# Patient Record
Sex: Male | Born: 1985 | Race: Black or African American | Hispanic: No | Marital: Single | Smoking: Current some day smoker
Health system: Southern US, Community
[De-identification: ages and names within clinical notes are randomized; demographics above are authoritative.]

## PROBLEM LIST (undated history)

## (undated) DIAGNOSIS — G8929 Other chronic pain: Secondary | ICD-10-CM

## (undated) DIAGNOSIS — M542 Cervicalgia: Secondary | ICD-10-CM

## (undated) DIAGNOSIS — M47812 Spondylosis without myelopathy or radiculopathy, cervical region: Secondary | ICD-10-CM

## (undated) DIAGNOSIS — M722 Plantar fascial fibromatosis: Secondary | ICD-10-CM

## (undated) DIAGNOSIS — M545 Low back pain, unspecified: Secondary | ICD-10-CM

## (undated) DIAGNOSIS — F329 Major depressive disorder, single episode, unspecified: Secondary | ICD-10-CM

## (undated) DIAGNOSIS — F419 Anxiety disorder, unspecified: Secondary | ICD-10-CM

## (undated) DIAGNOSIS — R454 Irritability and anger: Secondary | ICD-10-CM

## (undated) DIAGNOSIS — F32A Depression, unspecified: Secondary | ICD-10-CM

## (undated) DIAGNOSIS — F4310 Post-traumatic stress disorder, unspecified: Secondary | ICD-10-CM

---

## 2013-08-02 ENCOUNTER — Encounter (HOSPITAL_COMMUNITY): Payer: Self-pay | Admitting: General Practice

## 2013-08-02 ENCOUNTER — Emergency Department (HOSPITAL_COMMUNITY): Payer: Managed Care, Other (non HMO)

## 2013-08-02 ENCOUNTER — Encounter (HOSPITAL_COMMUNITY): Payer: Self-pay | Admitting: Emergency Medicine

## 2013-08-02 ENCOUNTER — Emergency Department (HOSPITAL_COMMUNITY)
Admission: EM | Admit: 2013-08-02 | Discharge: 2013-08-02 | Disposition: A | Discharge status: Other Acute Inpatient Hospital | Payer: Managed Care, Other (non HMO) | Attending: Emergency Medicine | Admitting: Emergency Medicine

## 2013-08-02 ENCOUNTER — Inpatient Hospital Stay (HOSPITAL_COMMUNITY)
Admission: AD | Admit: 2013-08-02 | Discharge: 2013-08-06 | DRG: 882 | Disposition: A | Discharge status: Home / Self Care | Source: Intra-hospital | Attending: Psychiatry | Admitting: Psychiatry

## 2013-08-02 DIAGNOSIS — R45850 Homicidal ideations: Secondary | ICD-10-CM

## 2013-08-02 DIAGNOSIS — M545 Low back pain, unspecified: Secondary | ICD-10-CM | POA: Diagnosis present

## 2013-08-02 DIAGNOSIS — F17200 Nicotine dependence, unspecified, uncomplicated: Secondary | ICD-10-CM | POA: Insufficient documentation

## 2013-08-02 DIAGNOSIS — F1010 Alcohol abuse, uncomplicated: Secondary | ICD-10-CM | POA: Insufficient documentation

## 2013-08-02 DIAGNOSIS — F4310 Post-traumatic stress disorder, unspecified: Principal | ICD-10-CM

## 2013-08-02 DIAGNOSIS — Z79899 Other long term (current) drug therapy: Secondary | ICD-10-CM

## 2013-08-02 DIAGNOSIS — F411 Generalized anxiety disorder: Secondary | ICD-10-CM

## 2013-08-02 DIAGNOSIS — R443 Hallucinations, unspecified: Secondary | ICD-10-CM | POA: Insufficient documentation

## 2013-08-02 DIAGNOSIS — M542 Cervicalgia: Secondary | ICD-10-CM | POA: Diagnosis present

## 2013-08-02 DIAGNOSIS — G8929 Other chronic pain: Secondary | ICD-10-CM | POA: Diagnosis present

## 2013-08-02 DIAGNOSIS — R4182 Altered mental status, unspecified: Secondary | ICD-10-CM | POA: Insufficient documentation

## 2013-08-02 HISTORY — DX: Depression, unspecified: F32.A

## 2013-08-02 HISTORY — DX: Anxiety disorder, unspecified: F41.9

## 2013-08-02 HISTORY — DX: Spondylosis without myelopathy or radiculopathy, cervical region: M47.812

## 2013-08-02 HISTORY — DX: Major depressive disorder, single episode, unspecified: F32.9

## 2013-08-02 HISTORY — DX: Other chronic pain: G89.29

## 2013-08-02 HISTORY — DX: Irritability and anger: R45.4

## 2013-08-02 HISTORY — DX: Post-traumatic stress disorder, unspecified: F43.10

## 2013-08-02 HISTORY — DX: Cervicalgia: M54.2

## 2013-08-02 HISTORY — DX: Low back pain: M54.5

## 2013-08-02 HISTORY — DX: Plantar fascial fibromatosis: M72.2

## 2013-08-02 HISTORY — DX: Low back pain, unspecified: M54.50

## 2013-08-02 LAB — URINALYSIS, ROUTINE W REFLEX MICROSCOPIC
Bilirubin Urine: NEGATIVE
Glucose, UA: NEGATIVE mg/dL
Hgb urine dipstick: NEGATIVE
Ketones, ur: NEGATIVE mg/dL
LEUKOCYTES UA: NEGATIVE
NITRITE: NEGATIVE
Protein, ur: NEGATIVE mg/dL
SPECIFIC GRAVITY, URINE: 1.017 (ref 1.005–1.030)
Urobilinogen, UA: 0.2 mg/dL (ref 0.0–1.0)
pH: 6 (ref 5.0–8.0)

## 2013-08-02 LAB — CBC WITH DIFFERENTIAL/PLATELET
BASOS PCT: 0 % (ref 0–1)
Basophils Absolute: 0 10*3/uL (ref 0.0–0.1)
EOS ABS: 0 10*3/uL (ref 0.0–0.7)
Eosinophils Relative: 0 % (ref 0–5)
HEMATOCRIT: 37.8 % — AB (ref 39.0–52.0)
HEMOGLOBIN: 13.4 g/dL (ref 13.0–17.0)
Lymphocytes Relative: 33 % (ref 12–46)
Lymphs Abs: 1.5 10*3/uL (ref 0.7–4.0)
MCH: 31 pg (ref 26.0–34.0)
MCHC: 35.4 g/dL (ref 30.0–36.0)
MCV: 87.5 fL (ref 78.0–100.0)
MONO ABS: 0.4 10*3/uL (ref 0.1–1.0)
MONOS PCT: 9 % (ref 3–12)
Neutro Abs: 2.6 10*3/uL (ref 1.7–7.7)
Neutrophils Relative %: 58 % (ref 43–77)
Platelets: 241 10*3/uL (ref 150–400)
RBC: 4.32 MIL/uL (ref 4.22–5.81)
RDW: 12.1 % (ref 11.5–15.5)
WBC: 4.5 10*3/uL (ref 4.0–10.5)

## 2013-08-02 LAB — RAPID URINE DRUG SCREEN, HOSP PERFORMED
Amphetamines: NOT DETECTED
BARBITURATES: NOT DETECTED
Benzodiazepines: NOT DETECTED
Cocaine: NOT DETECTED
Opiates: NOT DETECTED
Tetrahydrocannabinol: NOT DETECTED

## 2013-08-02 LAB — COMPREHENSIVE METABOLIC PANEL
ALBUMIN: 3.8 g/dL (ref 3.5–5.2)
ALT: 38 U/L (ref 0–53)
AST: 59 U/L — AB (ref 0–37)
Alkaline Phosphatase: 51 U/L (ref 39–117)
BUN: 8 mg/dL (ref 6–23)
CALCIUM: 8.3 mg/dL — AB (ref 8.4–10.5)
CO2: 22 meq/L (ref 19–32)
Chloride: 104 mEq/L (ref 96–112)
Creatinine, Ser: 0.88 mg/dL (ref 0.50–1.35)
GFR calc Af Amer: >90 mL/min (ref >90)
Glucose, Bld: 83 mg/dL (ref 70–99)
Potassium: 5.1 mEq/L (ref 3.7–5.3)
Sodium: 139 mEq/L (ref 137–147)
Total Bilirubin: 0.4 mg/dL (ref 0.3–1.2)
Total Protein: 7.5 g/dL (ref 6.0–8.3)

## 2013-08-02 LAB — POCT I-STAT TROPONIN I: Troponin i, poc: 0 ng/mL (ref 0.00–0.08)

## 2013-08-02 LAB — ETHANOL: Alcohol, Ethyl (B): 175 mg/dL — ABNORMAL HIGH (ref 0–11)

## 2013-08-02 MED ORDER — ALUM & MAG HYDROXIDE-SIMETH 200-200-20 MG/5ML PO SUSP
30.0000 mL | ORAL | Status: DC | PRN
  Administered 2013-08-05: 30 mL via ORAL

## 2013-08-02 MED ORDER — MAGNESIUM HYDROXIDE 400 MG/5ML PO SUSP: 30.0000 mL | Freq: Every day | ORAL | Status: DC | PRN

## 2013-08-02 MED ORDER — HYDROCHLOROTHIAZIDE 12.5 MG PO CAPS
12.5000 mg | ORAL_CAPSULE | Freq: Every day | ORAL | Status: DC
  Filled 2013-08-02: qty 1

## 2013-08-02 MED ORDER — IBUPROFEN 400 MG PO TABS: 600.0000 mg | ORAL_TABLET | Freq: Three times a day (TID) | ORAL | Status: DC | PRN

## 2013-08-02 MED ORDER — ACETAMINOPHEN 325 MG PO TABS: 650.0000 mg | ORAL_TABLET | ORAL | Status: DC | PRN

## 2013-08-02 MED ORDER — SODIUM CHLORIDE 0.9 % IV BOLUS (SEPSIS)
1000.0000 mL | Freq: Once | INTRAVENOUS | Status: AC
  Administered 2013-08-02: 1000 mL via INTRAVENOUS

## 2013-08-02 MED ORDER — HYDROCHLOROTHIAZIDE 12.5 MG PO CAPS
12.5000 mg | ORAL_CAPSULE | Freq: Every day | ORAL | Status: DC
  Administered 2013-08-02 – 2013-08-06 (×5): 12.5 mg via ORAL
  Filled 2013-08-02 (×7): qty 1

## 2013-08-02 MED ORDER — ACETAMINOPHEN 325 MG PO TABS: 650.0000 mg | ORAL_TABLET | Freq: Four times a day (QID) | ORAL | Status: DC | PRN

## 2013-08-02 MED ORDER — TRAZODONE HCL 50 MG PO TABS
50.0000 mg | ORAL_TABLET | Freq: Every evening | ORAL | Status: DC | PRN
  Administered 2013-08-02: 50 mg via ORAL
  Filled 2013-08-02: qty 1

## 2013-08-02 MED ORDER — NALOXONE HCL 0.4 MG/ML IJ SOLN
0.4000 mg | Freq: Once | INTRAMUSCULAR | Status: AC
  Administered 2013-08-02: 0.4 mg via INTRAVENOUS

## 2013-08-02 MED ORDER — LORAZEPAM 1 MG PO TABS
1.0000 mg | ORAL_TABLET | Freq: Three times a day (TID) | ORAL | Status: DC | PRN
  Administered 2013-08-02: 1 mg via ORAL
  Filled 2013-08-02: qty 1

## 2013-08-02 NOTE — Progress Notes (Signed)
Patient ID: Spencer Blankenship, male   DOB: 16-Mar-1986, 28 y.o.   MRN: [id number]  Spencer Blankenship is a 28 year old male admitted [facility]. Patient has a diagnosis of PTSD. Patient reports that he is anxious and agitated. Patient denies SI and A/V hallucinations but does report HI from time to time for his wife. Patient states, "I'm not [homicidal] right now but I told her if she takes my house and car she better look out for me." Patient reports that he has a son that he does not want because he already has an 28 year old son. Pt stated that she could take the younger son and leave. Patient has flat affect but cooperative during admission. Patient denies pain at this time. Q15 minute safety checks initiated and maintained. Patient had no questions or concerns at this time. Patient had elevated BP and NP was notified. Patient reports that lately he has been drinking every day but does not drink more than 6 drinks. UDS shows alcohol level of 175 this morning. Patient reports no symptoms of withdrawal.

## 2013-08-02 NOTE — ED Notes (Addendum)
Pt brought to ED by family with symptoms of PTSD.  Pt just came home from [employer] on Dec. 23.  Pt crying.   Appears very anxious.  Pt yelling "nobody is taking my house".

## 2013-08-02 NOTE — ED Notes (Signed)
MD at bedside. 

## 2013-08-02 NOTE — Tx Team (Addendum)
Initial Interdisciplinary Treatment Plan  PATIENT STRENGTHS: (choose at least two) Average or above average intelligence General fund of knowledge  PATIENT STRESSORS: Marital or family conflict Substance abuse   PROBLEM LIST: Problem List/Patient Goals Date to be addressed Date deferred Reason deferred Estimated date of resolution  PTSD      Anger      HI      Risk for Suicide                                     DISCHARGE CRITERIA:  Ability to meet basic life and health needs Improved stabilization in mood, thinking, and/or behavior  PRELIMINARY DISCHARGE PLAN: Attend aftercare/continuing care group Participate in family therapy  PATIENT/FAMIILY INVOLVEMENT: This treatment plan has been presented to and reviewed with the patient, Spencer Blankenship.  The patient and family have been given the opportunity to ask questions and make suggestions.  Marzetta BoardDopson, Christa E 08/02/2013, 7:33 PM

## 2013-08-02 NOTE — Progress Notes (Signed)
Problem list added per Christa

## 2013-08-02 NOTE — ED Notes (Addendum)
Pt. States that he was just released from [employer] on Dec. 23, 2014. 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. He has been having problems relaxing and fitting in since his release. He states he is feeling a lot of pressure due to a new baby with his wife that he said he did not want. 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 He was put on medications while in [employer] but he doesn't like the way they make him feel so he stop taking them. Since he stopped taking them he has noticed that it has been harder to control his anger impulses and the dreaming has become more frequent. There have been some outburst at home. He states that he was in both [place]. He was sent to a Psychologist just pryor to release for his PTSD. He tried to receive better treatment but he felt that because he was being released he was pushed out on through and not helped adequately. I asked him has he been [place] [facility] since [place] has been [place] and he said that he had a person who was responsible for his transition to the outside that he had missed phone calls with recently. The incident tonight stemmed around a friend having him try THC to help him relax but it had the opposite reaction. It made him paranoid. He is still stating that his house was his dream. When ask if he would hurt himself or anyone else. He answers " I would kill my wife if she tries to take my house." I spoke to him about his impulse control and Coping skills the fact that with him having PTSD they are not the same as everyone else. He agreed that he does need help in this area and would like to talk to someone.
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
## 2013-08-02 NOTE — ED Notes (Signed)
Called pt's mother per pt's request and advised pt is in ED and waiting placement. Advised her he will have to call to advise her of disposition. Voiced understanding. Requested for pt to call her.

## 2013-08-02 NOTE — ED Notes (Signed)
[facility] here to take pt. Belongings and paperwork given to [facility].

## 2013-08-02 NOTE — Progress Notes (Signed)
Adult Psychoeducational Group Note  Date:  08/02/2013 Time:  8:00 pm  Group Topic/Focus:  Wrap-Up Group:   The focus of this group is to help patients review their daily goal of treatment and discuss progress on daily workbooks.  Participation Level:  Minimal  Participation Quality:  Appropriate and Sharing  Affect:  Appropriate  Cognitive:  Appropriate  Insight: Appropriate  Engagement in Group:  Engaged  Modes of Intervention:  Discussion, Education, Socialization and Support  Additional Comments:  Pt stated the he does not feel comfortable speaking about personal issues in a group setting.   Nidya Bouyer 08/02/2013, 10:39 PM

## 2013-08-02 NOTE — ED Provider Notes (Signed)
CSN: [id number]     Arrival date & time 08/02/13  0056 History   First MD Initiated Contact with Patient 08/02/13 0127     Chief Complaint  Patient presents with  . Panic Attack   (Consider location/radiation/quality/duration/timing/severity/associated sxs/prior Treatment) The history is provided by the patient. The history is limited by the condition of the patient.  Spencer Blankenship is a 28 y.o. male hx of PTSD recently started on medicine here with panic attack and hallucinations. He was recently discharged from the [employer] about a week ago and started on medicines for PTSD. He was told not to drink any alcohol and today she was at a party and drank some alcohol. Subsequently he began having some panic attacks and hallucinations. In triage he told the nurse that he was anxious that people don't take her his house away. However in the exam room he is very lethargic and only responds to sternal rub. He was unable to give much history. Denies any other overdose.   Level V caveat- AMS   History reviewed. No pertinent past medical history. History reviewed. No pertinent past surgical history. No family history on file. History  Substance Use Topics  . Smoking status: Current Some Day Smoker  . Smokeless tobacco: Not on file  . Alcohol Use: Yes    Review of Systems  Unable to perform ROS: Acuity of condition    Allergies  Review of patient's allergies indicates not on file.  Home Medications  No current outpatient prescriptions on file. BP 128/78  Pulse 76  Temp(Src) 97.9 F (36.6 C) (Oral)  Resp 13  SpO2 97% Physical Exam  Nursing note and vitals reviewed. Constitutional:  Lethargic, arousable to sternal rub. Even with ammonia, barely arousable.   HENT:  Head: Normocephalic.  Mouth/Throat: Oropharynx is clear and moist.  Eyes: Conjunctivae and EOM are normal. Pupils are equal, round, and reactive to light.  Pupils equal and reactive   Neck: Normal range of motion. Neck supple.   Cardiovascular: Normal rate, regular rhythm and normal heart sounds.   Pulmonary/Chest: Effort normal and breath sounds normal. No respiratory distress. He has no wheezes. He has no rales.  Abdominal: Soft. Bowel sounds are normal. He exhibits no distension. There is no tenderness. There is no rebound.  Musculoskeletal: Normal range of motion.  Neurological:  Lethargic. Able to move all extremities   Skin: Skin is warm and dry.  Psychiatric:  Unable due to mental status.     ED Course  Procedures (including critical care time) Labs Review Labs Reviewed  CBC WITH DIFFERENTIAL - Abnormal; Notable for the following:    HCT 37.8 (*)    All other components within normal limits  ETHANOL - Abnormal; Notable for the following:    Alcohol, Ethyl (B) 175 (*)    All other components within normal limits  COMPREHENSIVE METABOLIC PANEL - Abnormal; Notable for the following:    Calcium 8.3 (*)    AST 59 (*)    All other components within normal limits  URINALYSIS, ROUTINE W REFLEX MICROSCOPIC  URINE RAPID DRUG SCREEN (HOSP PERFORMED)  POCT I-STAT TROPONIN I   Imaging Review Ct Head Wo Contrast  08/02/2013   CLINICAL DATA:  Altered mental status  EXAM: CT HEAD WITHOUT CONTRAST  TECHNIQUE: Contiguous axial images were obtained from the base of the skull through the vertex without intravenous contrast.  COMPARISON:  None available  FINDINGS: There is no acute intracranial hemorrhage or infarct. No mass lesion or midline shift.  Gray-white matter differentiation is well maintained. Ventricles are normal in size without evidence of hydrocephalus. CSF containing spaces are within normal limits. No extra-axial fluid collection.  The calvarium is intact.  Orbital soft tissues are within normal limits.  The paranasal sinuses and mastoid air cells are well pneumatized and free of fluid.  Scalp soft tissues are unremarkable.  IMPRESSION: No acute intracranial process.   Electronically Signed   By: Rise Mu M.D.   On: 08/02/2013 05:37   Dg Chest Portable 1 View  08/02/2013   CLINICAL DATA:  Altered mental status  EXAM: PORTABLE CHEST - 1 VIEW  COMPARISON:  None available  FINDINGS: The cardiac silhouette is prominent, likely related to shallow lung inflation and AP projection.  The lungs are mildly hypoinflated. . No airspace consolidation, pleural effusion, or pulmonary edema is identified. There is no pneumothorax.  No acute osseous abnormality identified.  IMPRESSION: No acute cardiopulmonary process.   Electronically Signed   By: Rise Mu M.D.   On: 08/02/2013 02:22    EKG Interpretation   None       MDM  No diagnosis found. Spencer Blankenship is a 28 y.o. male here with anxiety, hallucinations. He is quite lethargic here. Will consider overdose vs metabolic reasons vs psych. Will check drug tox, urine tox, labs. Will attempt to give narcan and reassess. Will need psych eval when more awake.   6:23 AM Alert and awake now. Labs unremarkable. CT head done since he was altered and was normal. ETOH 175. Will call TTS. Medically cleared now.    Richardean Canal, MD 08/02/13 [phone]

## 2013-08-03 DIAGNOSIS — F4310 Post-traumatic stress disorder, unspecified: Principal | ICD-10-CM

## 2013-08-03 DIAGNOSIS — F411 Generalized anxiety disorder: Secondary | ICD-10-CM

## 2013-08-03 DIAGNOSIS — F1010 Alcohol abuse, uncomplicated: Secondary | ICD-10-CM | POA: Diagnosis present

## 2013-08-03 DIAGNOSIS — R45850 Homicidal ideations: Secondary | ICD-10-CM

## 2013-08-03 MED ORDER — PRAZOSIN HCL 2 MG PO CAPS
2.0000 mg | ORAL_CAPSULE | Freq: Every day | ORAL | Status: DC
  Administered 2013-08-03: 2 mg via ORAL
  Filled 2013-08-03 (×2): qty 1
  Filled 2013-08-03: qty 2

## 2013-08-03 MED ORDER — CITALOPRAM HYDROBROMIDE 10 MG PO TABS
10.0000 mg | ORAL_TABLET | Freq: Every day | ORAL | Status: DC
  Administered 2013-08-03 – 2013-08-05 (×3): 10 mg via ORAL
  Filled 2013-08-03 (×4): qty 1

## 2013-08-03 MED ORDER — FLUOXETINE HCL 20 MG PO CAPS: 20.0000 mg | ORAL_CAPSULE | Freq: Every day | ORAL | Status: DC

## 2013-08-03 NOTE — Progress Notes (Signed)
Patient ID: Spencer Blankenship, male   DOB: 05-07-1986, 28 y.o.   MRN: [id number]  D: Pt informed the writer that while in the [employer] he had a therapist. Stated that after discharge he was referred to a therapist in [place]. Stated he came to [place] along with his wife to visit his cousin. Stated that he was already feeling stressed and "blew up" after his wife began "going off on his cousin" because his cousin wanted him to smoke a joint.  Pt stated he began telling his wife that he didn't want to be married or have any more children. Stated she began arguing and his cousin removed him from the home and went to find some thc. Pt stated, "I really don't think the marijuana helped".  Pt stated that after several minutes of riding around his cousin took him to the hosp. Stated, "that's how I ended up over here". Pt plans to return home after discharge.  A:  Support and encouragement was offered. 15 min checks continued for safety.  R: Pt remains safe.

## 2013-08-03 NOTE — Progress Notes (Signed)
Adult Psychoeducational Group Note  Date:  08/03/2013 Time:  10:00 am  Group Topic/Focus:  Wellness Toolbox:   The focus of this group is to discuss various aspects of wellness, balancing those aspects and exploring ways to increase the ability to experience wellness.  Patients will create a wellness toolbox for use upon discharge.  Participation Level:  Did Not Attend  Cranford MonBeaudry, Christalynn Boise Evans 08/03/2013, 2:45 PM
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
## 2013-08-03 NOTE — [facility] Suicide Risk Assessment (Signed)
Suicide Risk Assessment  Admission Assessment     Nursing information obtained from:  Patient Demographic factors:  Male Current Mental Status:  Thoughts of violence towards others Loss Factors:  Loss of significant relationship Historical Factors:  NA Risk Reduction Factors:  Responsible for children under 28 years of age;Living with another person, especially a relative  CLINICAL FACTORS:   Alcohol/Substance Abuse/Dependencies  COGNITIVE FEATURES THAT CONTRIBUTE TO RISK:  Polarized thinking    SUICIDE RISK:   Minimal: No identifiable suicidal ideation.  Patients presenting with no risk factors but with morbid ruminations; may be classified as minimal risk based on the severity of the depressive symptoms  PLAN OF CARE:1. Admit for crisis management and stabilization. 2. Medication management to reduce current symptoms to base line and improve the   patient's overall level of functioning 3. Treat health problems as indicated. 4. Develop treatment plan to decrease risk of relapse upon discharge and the need for  readmission. 5. Psycho-social education regarding relapse prevention and self care. 6. Health care follow up as needed for medical problems. 7. Restart home medications where appropriate.   I certify that inpatient services furnished can reasonably be expected to improve the patient's condition.  Thedore MinsAkintayo, Nyssa Sayegh, MD 08/03/2013, 10:10 AM
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
## 2013-08-03 NOTE — [facility] Group Notes (Signed)
[facility] LCSW Group Therapy  08/03/2013 1:15 pm  Type of Therapy: Process Group Therapy  Participation Level:  Did not attend    Summary of Progress/Problems: Today's group addressed the issue of overcoming obstacles.  Patients were asked to identify their biggest obstacle post d/c that stands in the way of their on-going success, and then problem solve as to how to manage this.  Spencer Blankenship, Brach Birdsall B 08/03/2013   4:17 PM

## 2013-08-03 NOTE — Tx Team (Signed)
  Interdisciplinary Treatment Plan Update   Date Reviewed:  08/03/2013  Time Reviewed:  8:29 AM  Progress in Treatment:   Attending groups: In consistently Participating in groups: Minimally Taking medication as prescribed: Yes  Tolerating medication: Yes Family/Significant other contact made: No  Patient understands diagnosis: Yes AEB asking for help with his PTSD symptoms, and psychosis Discussing patient identified problems/goals with staff: Yes  See initial care plan Medical problems stabilized or resolved: Yes Denies suicidal/homicidal ideation: Yes  In tx team Patient has not harmed self or others: Yes  For review of initial/current patient goals, please see plan of care.  Estimated Length of Stay:  4-5 days  Reason for Continuation of Hospitalization: Anxiety Depression Hallucinations Medication stabilization  New Problems/Goals identified:  N/A  Discharge Plan or Barriers:   return home, follow up outpt  Additional Comments: Spencer Blankenship is an 28 y.o. male That [facility] he was just released from [employer] on Dec. 23, 2014. 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. He has been having problems relaxing and fitting in since his release by report. He states he is feeling a lot of pressure due to a new baby with his wife that he said he did not want. 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 Pt stated he was put on medications while in [employer] but he doesn't like the way they make him feel so he stopped taking them. Since he stopped taking them he has noticed that it has been harder to control his anger impulses and dreaming has become more frequent and vivid. There have been some anger outbursts at home per his report. He states that he was in both [place] and [place]. He [facility]  diagnosis of PTSD.    Attendees:  Signature: Thedore MinsMojeed Akintayo, MD 08/03/2013 8:29 AM   Signature: Richelle Itood Isaic Syler, LCSW 08/03/2013 8:29 AM  Signature: Fransisca KaufmannLaura Davis, NP 08/03/2013 8:29 AM  Signature: Joslyn Devonaroline Beaudry, RN 08/03/2013 8:29 AM  Signature: Liborio NixonPatrice White, RN 08/03/2013 8:29 AM  Signature:  08/03/2013 8:29 AM  Signature:   08/03/2013 8:29 AM  Signature:    Signature:    Signature:    Signature:    Signature:    Signature:      Scribe for Treatment Team:   Richelle Itood Katianna Mcclenney, LCSW  08/03/2013 8:29 AM

## 2013-08-03 NOTE — Progress Notes (Signed)
  D: Pt observed sleeping in bed with eyes closed. RR even and unlabored. No distress noted  .  A: Q 15 minute checks were done for safety.  R: safety maintained on unit.  

## 2013-08-03 NOTE — [facility] Group Notes (Signed)
[facility]   08/03/2013 8:30 AM  Participation Quality:  Engaged  Mood/Affect:  Depressed  Depression Rating:  8  Anxiety Rating:  6  Thoughts of Suicide:  No Will you contract for safety?   Yes  Current AVH:  No  Plan for Discharge/Comments:  Kendell Baneroy states he came to [place] with wife to visit relatives and friends, and began to experience PTSD symptoms, along with depression.  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  [facility]  Transportation Means: unk  Supports: family  Kiribatiorth, WhartonRodney B

## 2013-08-03 NOTE — Progress Notes (Signed)
Adult Psychoeducational Group Note  Date:  08/03/2013 Time:  10:00 PM  Group Topic/Focus:  Wrap-Up Group:   The focus of this group is to help patients review their daily goal of treatment and discuss progress on daily workbooks.  Participation Level:  Active  Participation Quality:  Appropriate  Affect:  Appropriate  Cognitive:  Appropriate  Insight: Appropriate  Engagement in Group:  Engaged  Modes of Intervention:  Support  Additional Comments:  Patient attended and participated in group tonight. He reports that today he caught up on some sleep. Went to dinner and watched a movie. For his wellness he plans to continue taking his medication and learning ways to deal with triggers.  Spencer Blankenship, Spencer Blankenship [facility] 08/03/2013, 10:00 PM

## 2013-08-04 MED ORDER — QUETIAPINE FUMARATE 50 MG PO TABS
50.0000 mg | ORAL_TABLET | Freq: Every day | ORAL | Status: DC
  Administered 2013-08-04 – 2013-08-05 (×2): 50 mg via ORAL
  Filled 2013-08-04 (×3): qty 1

## 2013-08-04 MED ORDER — PRAZOSIN HCL 1 MG PO CAPS
5.0000 mg | ORAL_CAPSULE | Freq: Every day | ORAL | Status: DC
  Administered 2013-08-04 – 2013-08-05 (×2): 5 mg via ORAL
  Filled 2013-08-04 (×3): qty 5

## 2013-08-04 MED ORDER — QUETIAPINE FUMARATE 25 MG PO TABS
25.0000 mg | ORAL_TABLET | Freq: Every day | ORAL | Status: DC
  Administered 2013-08-04: 25 mg via ORAL
  Filled 2013-08-04 (×4): qty 1

## 2013-08-04 NOTE — [facility] Counselor (Signed)
Adult Comprehensive Assessment  Patient ID: Spencer Blankenship, male   DOB: September 21, 1985, 28 y.o.   MRN: [id number]  Information Source: Information source: Patient  Current Stressors:  Educational / Learning stressors: N/A Employment / Job issues: Yes  Currently unemployed Family Relationships: Yes  Stressed with the birth of a new son.  Feels like his wife needs too much from him right now. Financial / Lack of resources (include bankruptcy): Yes No income Housing / Lack of housing: N/A Physical health (include injuries & life threatening diseases): Yes  Lots of pain that does not allow him to be as active as he would like Social relationships: N/A Substance abuse: Yes  Admits to increased drinking over the last month since birth of son Bereavement / Loss: N/A  Living/Environment/Situation:  Living conditions (as described by patient or guardian): [place] How long has patient lived in current situation?: 4 years,  [place] to [place] What is atmosphere in current home: Supportive;Comfortable;Chaotic  Family History:  Marital status: Married Number of Years Married: 4 What types of issues is patient dealing with in the relationship?: "Its hectic with a newborn" Does patient have children?: Yes How many children?: 2 How is patient's relationship with their children?: good  Admits that he feels overwhelmed as father "I don't think I am a good role model, and I don't have patience."  Childhood History:  By whom was/is the patient raised?: Mother/father and step-parent (mother and grandmother) Additional childhood history information: father was not in picture Description of patient's relationship with caregiver when they were a child: good Patient's description of current relationship with people who raised him/her: awkward with dad, good with mom and grandmother Does patient have siblings?: Yes Number of Siblings:  4 Description of patient's current relationship with siblings: All step siblings-no communication Did patient suffer any verbal/emotional/physical/sexual abuse as a child?: No Did patient suffer from severe childhood neglect?: No Has patient ever been sexually abused/assaulted/raped as an adolescent or adult?: No Was the patient ever a victim of a crime or a disaster?: No Witnessed domestic violence?: No Has patient been effected by domestic violence as an adult?: No  Education:  Highest grade of school patient has completed: Diploma through [employer] Currently a Consulting civil engineerstudent?: No Learning disability?: No  Employment/Work Situation:   Employment situation: Unemployed Patient's job has been impacted by current illness: Yes Describe how patient's job has been impacted: PTSD is getting in the way What is the longest time patient has a held a job?: 6 years 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  "I just go to the garage and drink" Alcohol/Substance Abuse Treatment Hx: Past Tx, Outpatient If yes, describe treatment: [facility] Has alcohol/substance abuse ever caused legal problems?: No  Social Support System:   Patient's Community Support System: Fair Describe Community Support System: family Type of faith/religion: [employer] How does patient's faith help to cope with current illness?: N/A  Leisure/Recreation:   Leisure  and Hobbies: nothing "I can't run like I used to cause of pain."  "It's  not good"  Strengths/Needs:   What things does the patient do well?: provider, research, planning In what areas does patient struggle / problems for patient: sanity  Discharge Plan:   Does patient have access to transportation?: Yes Will patient be returning to same living situation after discharge?: Yes Currently receiving community mental health services: Yes (From Whom) (Dr in [place])  Summary/Recommendations:   Summary and Recommendations (to be completed by the evaluator): Spencer Blankenship is a 28 YO AA male 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  He suffers from PTSD, physical issues, and is stressed about not having a job and running out of money, as well as an infant in the home.  "I feel too worn down for only being 28 Years old."  He hopes to get help for vocational training/placement through the [place], and is working with people there to increase his connection with them.  He can benefit from crises stabilization, medication managment, therapeutic milieu and referral for services.  Daryel Gerald B. 08/04/2013

## 2013-08-04 NOTE — Progress Notes (Signed)
D: Pt denies SI/HI/AVH. Pt presents with flat affect and depressed mood. Pt reports poor sleep last night and nightmares. Pt c/o of feeling drowsy this morning. Pt stated that he took Seroquel early this morning because he could not sleep d/t nightmares around 2 am . No adverse reactions to meds verbalized by pt. Pt concerned about starting new meds and then returning back home to take his home meds. Writer informed pt to talk to his psychiatrist before taking home meds. Pt compliant with taking meds and attending groups. A: Medications administered as ordered per MD. Verbal support given. Pt encouraged to attend groups. 15 minute checks performed for safety. R: Pt safety maintained.

## 2013-08-04 NOTE — Progress Notes (Signed)
[facility]  08/04/2013 2:23 PM Spencer Blankenship  MRN:  [id number] Subjective:   Patient states "I am just getting frustrated. I did not sleep well last night. My wife is trying to manage things that I normally do but I am stuck here. I can't believe my cousin brought me here. If I had not drank that alcohol then none of this would have happened."  Objective:  Patient observed resting in bed this afternoon. He has per documentation in epic attending most groups on the unit. Patient appears to be minimizing the reasons that led to his admission. He asked Clinical research associatewriter [facility] as he feels that he does not need to be in this hospital. He continues to appear depressed and anxious. Patient complains of very poor sleep last night and is excessively worrying about issues outside the hospital.   Diagnosis:   DSM5: Schizophrenia Disorders:  Obsessive-Compulsive Disorders:  Trauma-Stressor Disorders: Posttraumatic Stress Disorder (309.81)  Substance/Addictive Disorders:  Depressive Disorders:  AXIS I: Posttraumatic Stress Disorder, Anxiety state, unspecified, Alcohol abuse  AXIS II: Deferred  AXIS III:  Past Medical History   Diagnosis  Date   .  Chronic lower back pain    .  Anxiety    .  Depression    .  Anger    .  Plantar fasciitis    .  Post traumatic stress disorder (PTSD)    .  Chronic neck pain    .  Degenerative arthritis of cervical spine     AXIS IV: economic problems, occupational problems, other psychosocial or environmental problems and problems with primary support group  AXIS V: 41-50 serious symptoms   ADL's:  Intact  Sleep: Poor  Appetite:  Fair  Suicidal Ideation:  Denies  Homicidal Ideation:  Denies  AEB (as evidenced by):  Psychiatric Specialty Exam: Review of Systems  Constitutional: Negative.   HENT: Negative.   Eyes: Negative.   Respiratory: Negative.   Cardiovascular: Negative.   Gastrointestinal: Negative.   Genitourinary: Negative.    Musculoskeletal: Negative.   Skin: Negative.   Neurological: Negative.   Endo/Heme/Allergies: Negative.   Psychiatric/Behavioral: Positive for depression and substance abuse. Negative for suicidal ideas, hallucinations and memory loss. The patient is nervous/anxious and has insomnia.     Blood pressure 135/88, pulse 123, temperature 98 F (36.7 C), temperature source Oral, resp. rate 18, height 6\' 3"  (1.905 m), weight 117.028 kg (258 lb).Body mass index is 32.25 kg/(m^2).  General Appearance: Disheveled  Eye Contact::  Good  Speech:  Clear and Coherent  Volume:  Normal  Mood:  Anxious and Irritable  Affect:  Constricted  Thought Process:  Goal Directed and Intact  Orientation:  Full (Time, Place, and Person)  Thought Content:  Rumination  Suicidal Thoughts:  No  Homicidal Thoughts:  No  Memory:  Immediate;   Good Recent;   Good Remote;   Good  Judgement:  Fair  Insight:  Shallow  Psychomotor Activity:  Decreased  Concentration:  Good  Recall:  Good  Akathisia:  No  Handed:  Right  AIMS (if indicated):     Assets:  Communication Skills Desire for Improvement Housing Intimacy Leisure Time Physical Health Resilience Social Support  Sleep:  Number of Hours: 2.25   Current Medications: Current Facility-Administered Medications  Medication Dose Route Frequency Provider Last Rate Last Dose  . acetaminophen (TYLENOL) tablet 650 mg  650 mg Oral Q6H PRN Nanine MeansJamison Lord, NP      . alum &  mag hydroxide-simeth (MAALOX/MYLANTA) 200-200-20 MG/5ML suspension 30 mL  30 mL Oral Q4H PRN Nanine Means, NP      . citalopram (CELEXA) tablet 10 mg  10 mg Oral Daily Mojeed Akintayo   10 mg at 08/04/13 0755  . hydrochlorothiazide (MICROZIDE) capsule 12.5 mg  12.5 mg Oral Daily Nanine Means, NP   12.5 mg at 08/04/13 0755  . magnesium hydroxide (MILK OF MAGNESIA) suspension 30 mL  30 mL Oral Daily PRN Nanine Means, NP      . prazosin (MINIPRESS) capsule 5 mg  5 mg Oral QHS Mojeed Akintayo      .  QUEtiapine (SEROQUEL) tablet 25 mg  25 mg Oral QHS Kerry Hough, PA-C   25 mg at 08/04/13 0159    Lab Results: No results found for this or any previous visit (from the past 48 hour(s)).  Physical Findings: AIMS: Facial and Oral Movements Muscles of Facial Expression: None, normal Lips and Perioral Area: None, normal Jaw: None, normal Tongue: None, normal,Extremity Movements Upper (arms, wrists, hands, fingers): None, normal Lower (legs, knees, ankles, toes): None, normal, Trunk Movements Neck, shoulders, hips: None, normal, Overall Severity Severity of abnormal movements (highest score from questions above): None, normal Incapacitation due to abnormal movements: None, normal Patient's awareness of abnormal movements (rate only patient's report): No Awareness,    CIWA:    COWS:     Treatment Plan Summary: Daily contact with patient to assess and evaluate symptoms and progress in treatment Medication management  Plan: Continue crisis management and stabilization.  Medication management: Increase Seroquel to 50 mg at hs for insomnia/anxiety.  Encouraged patient to attend groups and participate in group counseling sessions and activities.  Discharge plan in progress.  Continue current treatment plan.   Medical Decision Making Problem Points:  Established problem, stable/improving (1) and Review of psycho-social stressors (1) Data Points:  Review of medication regiment & side effects (2)  I certify that inpatient services furnished can reasonably be expected to improve the patient's condition.   Pamala Hayman NP-C 08/04/2013, 2:23 PM

## 2013-08-04 NOTE — [facility] Group Notes (Signed)
[facility] Group Notes:  (Nursing/MHT/Case Management/Adjunct)  Date:  08/04/2013  Time:  10:36 AM  Type of Therapy:  wellness  Participation Level:  Active  Participation Quality:  Appropriate  Affect:  Appropriate  Cognitive:  Appropriate  Insight:  Appropriate  Engagement in Group:  Engaged  Modes of Intervention:  Discussion and Education  Summary of Progress/Problems: "Short-term goal is to take meds, get in touch with my old psychiatrist and find activities to take my mind of things".   Dreanna Kyllo L 08/04/2013, 10:36 AM

## 2013-08-04 NOTE — [facility] Group Notes (Signed)
Adult Psychoeducational Group Note  Date:  08/04/2013 Time:  9:59 PM  Group Topic/Focus:  Wrap-Up Group:   The focus of this group is to help patients review their daily goal of treatment and discuss progress on daily workbooks.  Participation Level:  Active  Participation Quality:  Appropriate  Affect:  Appropriate  Cognitive:  Appropriate  Insight: Appropriate  Engagement in Group:  Engaged  Modes of Intervention:  Discussion  Additional Comments:  Spencer Blankenship stated his day was all right.  He enjoyed lunch and dinner, got a nap, talked to social worker 1:1, and the groups were productive.  He stated that he is a caring and understanding person.  Spencer Blankenship, Spencer Blankenship A 08/04/2013, 9:59 PM

## 2013-08-04 NOTE — [facility] Group Notes (Signed)
[facility] LCSW Group Therapy  08/04/2013 , 4:32 PM   Type of Therapy:  Group Therapy  Participation Level:  Active  Participation Quality:  Attentive  Affect:  Appropriate  Cognitive:  Alert  Insight:  Improving  Engagement in Therapy:  Engaged  Modes of Intervention:  Discussion, Exploration and Socialization  Summary of Progress/Problems: Today's group focused on the term Diagnosis.  Participants were asked to define the term, and then pronounce whether it is a negative, positive or neutral term.  Kendell Baneroy was engaged throughout.  He gave examples of self diagnosis and diagnosis by others besides Drs.  He also talked a lot about labels Quincy Carnes["they are too confining"] and how they often lead to stereotypes.  This then led to a discussion about racism, and he gave examples of how that has affected him personally.  He was patient and tolerant of another patient who was focused on "reverse discrimination."  Daryel Geraldorth, Rucker Pridgeon B 08/04/2013 , 4:32 PM

## 2013-08-05 MED ORDER — HYDROXYZINE HCL 50 MG PO TABS
50.0000 mg | ORAL_TABLET | Freq: Four times a day (QID) | ORAL | Status: DC | PRN
  Administered 2013-08-05: 50 mg via ORAL
  Filled 2013-08-05: qty 1

## 2013-08-05 MED ORDER — CITALOPRAM HYDROBROMIDE 20 MG PO TABS
20.0000 mg | ORAL_TABLET | Freq: Every day | ORAL | Status: DC
  Administered 2013-08-06: 20 mg via ORAL
  Filled 2013-08-05 (×2): qty 1

## 2013-08-05 NOTE — Progress Notes (Signed)
Adult Psychoeducational Group Note  Date:  08/05/2013 Time:  10:01 PM  Group Topic/Focus:  Wrap-Up Group:   The focus of this group is to help patients review their daily goal of treatment and discuss progress on daily workbooks.  Participation Level:  Minimal  Participation Quality:  Sharing  Affect:  Appropriate  Cognitive:  Appropriate  Insight: Limited  Engagement in Group:  Limited  Modes of Intervention:  Support  Additional Comments:  Patient attended and participated in group tonight. He reports that he went to his meals, went to groups, wrote in his journal, took a nap, and called home today. For his personal development he plans to work on his physical and mental wellness. He plans to join a fitness center by his house, take his medication as schedule and [facility] to improve on himself  Scot DockFrancis, Jared Cahn Dacosta 08/05/2013, 10:01 PM

## 2013-08-05 NOTE — Tx Team (Signed)
  Interdisciplinary Treatment Plan Update   Date Reviewed:  08/05/2013  Time Reviewed:  1:50 PM  Progress in Treatment:   Attending groups: Yes Participating in groups: Yes Taking medication as prescribed: Yes  Tolerating medication: Yes Family/Significant other contact made: Yes  Patient understands diagnosis: Yes  Discussing patient identified problems/goals with staff: Yes Medical problems stabilized or resolved: Yes Denies suicidal/homicidal ideation: Yes Patient has not harmed self or others: Yes  For review of initial/current patient goals, please see plan of care.  Estimated Length of Stay:  Likely d/c tomorrow  Reason for Continuation of Hospitalization:   New Problems/Goals identified:  N/A  Discharge Plan or Barriers:   return home, follow up outpt  Additional Comments:  Attendees:  Signature: Thedore MinsMojeed Akintayo, MD 08/05/2013 1:50 PM   Signature: Richelle Itood Jehiel Koepp, LCSW 08/05/2013 1:50 PM  Signature: Fransisca KaufmannLaura Davis, NP 08/05/2013 1:50 PM  Signature: Joslyn Devonaroline Beaudry, RN 08/05/2013 1:50 PM  Signature: Liborio NixonPatrice White, RN 08/05/2013 1:50 PM  Signature:  08/05/2013 1:50 PM  Signature:   08/05/2013 1:50 PM  Signature:    Signature:    Signature:    Signature:    Signature:    Signature:      Scribe for Treatment Team:   Richelle Itood Janyah Singleterry, LCSW  08/05/2013 1:50 PM

## 2013-08-05 NOTE — [facility] Suicide Risk Assessment (Signed)
[facility] INPATIENT:  Family/Significant Other Suicide Prevention Education  Suicide Prevention Education:  Contact Attempts: Spencer BailiffRobin Blankenship, mother, [phone] has been identified by the patient as the family member/significant other with whom the patient will be residing, and identified as the person(s) who will aid the patient in the event of a mental health crisis.  With written consent from the patient, two attempts were made to provide suicide prevention education, prior to and/or following the patient's discharge.  We were unsuccessful in providing suicide prevention education.  A suicide education pamphlet was given to the patient to share with family/significant other.  Date and time of first attempt:08/04/2013  9:40AM  Date and time of second attempt:08/05/2013 3:05 PM   Spencer Blankenship, Spencer Blankenship 08/05/2013, 3:04 PM

## 2013-08-05 NOTE — Progress Notes (Signed)
Patient ID: Spencer Blankenship, male   DOB: 1985/08/09, 28 y.o.   MRN: [id number]  D: Pt informed the writer of chest pain. Stated that he felt it earlier and was given maalox. However, stated it hadn't completely gone away. When asked if he has a history of heart problems, pt stated "my doctor said that it's caused by stress." Pt attempted to give an analogy of the skin on the chicken and how when he gets stressed the skin tightens causing chest pain. Pt asked if we could give him "something to help with the stress". Pt realizes he's anxious about impending discharge tomorrow. Stated, he has to figure out how he's going to get home. "I have the money, just need to make the arrangements".  Informed pt that writer would check with the extender. Checked pt's vs which were, 127/81, 87, 96% RA.   A: Received order from PA for vistaril 50mg  q6prn. 15 min checks continued for safety.  R: Pt remains safe.

## 2013-08-05 NOTE — Progress Notes (Signed)
Patient ID: Spencer Blankenship, male   DOB: 1986-04-17, 28 y.o.   MRN: [id number]  D: Pt informed the writer that he plans to return home, but imagines that he'll be drinking and smoking cigs again. Writer discussed importance of pt continuing with treatment and taking his meds. Pt spoke of being stressed and he and Clinical research associatewriter discussed possible healthy ways to de stress. After several mins of discussion pt stated that he'd like to lose weight. 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  However, stated he doesn't want to add another bill. Writer suggested that pt walk his dog around his neighbor varying the times depended upon what he needs. Pt also stated he [place], but doesn't want his wife and 2 wk old son to travel with him.   A:  Support and encouragement was offered. 15 min checks continued for safety.  R: Pt remains safe.

## 2013-08-05 NOTE — Progress Notes (Signed)
D: Pt denies SI/HI/AVH. Pt reports having nightmares last night off and on throughout the night. Pt c/o feeling drowsy in the morning time d/t taking bedtime meds. Pt presents with flat affect and depressed mood. Pt mood is appropriate and no aggressive behaviors noted by Clinical research associatewriter. Pt compliant with taking meds and attending groups. A: Medications administered as ordered per MD. Verbal support given. Pt encouraged to attend groups. 15 minute checks performed for safety. Pt safety maintained.

## 2013-08-05 NOTE — [facility] Group Notes (Signed)
[facility] [employer] Group Therapy  08/05/2013 , 1:23 PM    Type of Therapy:  [employer] Presentation  Participation Level:  Active  Participation Quality:  Attentive  Affect:  Blunted  Cognitive:  Oriented  Insight:  Limited  Engagement in Therapy:  Engaged  Modes of Intervention:  Discussion, Education and Socialization  Summary of Progress/Problems:  David from [employer] came to present his recovery story and play the guitar.  Sat quietly throughout the presentation.  Spencer Blankenship B 08/05/2013 , 1:23 PM    

## 2013-08-05 NOTE — [facility] Group Notes (Signed)
[facility]   08/05/2013 11:36 AM  Participation Quality:  Engaged  Mood/Affect:  Depressed  Depression Rating:  5  Anxiety Rating:  5  Thoughts of Suicide:  No Will you contract for safety?   NA  Current AVH:  No  Plan for Discharge/Comments:  Hopes to get out of here soon.  Says he spoke with cousin last night who is planning on getting his car for him so that when he is released, he can drive back home rather than having family pick him up.  Transportation Means: see above  Supports: family  Spencer Blankenship, Spencer Blankenship

## 2013-08-05 NOTE — Progress Notes (Signed)
Seen and agreed. Yasaman Kolek, MD 

## 2013-08-05 NOTE — Progress Notes (Signed)
Patient ID: Spencer Blankenship, male   DOB: 06-Jun-1986, 28 y.o.   MRN: [id number] [facility] MD Progress Note  08/05/2013 10:45 AM [name]  MRN:  [id number] Subjective:   "I am still having nightmares but now sleeping a little better.''just getting frustrated. I did not sleep well last night. My wife is trying to manage things that I normally do but I am stuck here. I can't believe my cousin brought me here. If I had not drank that alcohol then none of this would have happened."  Objective: Patient is requesting to be discharged home soon, he says that he misses his family and he believes that his admission is not justify because his cousin over reacted by bringing him to a psychiatric hospital. He states that he is doing better on his current medication regimen. He reports decreased anxiety, depression, nightmares, mood swings and agitation. He is compliant with his medications and has not endorsed any adverse reactions.   Diagnosis:   DSM5: Schizophrenia Disorders:  Obsessive-Compulsive Disorders:  Trauma-Stressor Disorders: Posttraumatic Stress Disorder (309.81)  Substance/Addictive Disorders:  Depressive Disorders:  AXIS I: Posttraumatic Stress Disorder. Anxiety state, unspecified. Alcohol abuse  AXIS II: Deferred  AXIS III:  Past Medical History   Diagnosis  Date   .  Chronic lower back pain    .  Anger    .  Plantar fasciitis    .  Chronic neck pain    .  Degenerative arthritis of cervical spine     AXIS IV: economic problems, occupational problems, other psychosocial or environmental problems and problems with primary support group  AXIS V: 41-50 serious symptoms   ADL's:  Intact  Sleep: fair  Appetite:  Fair  Suicidal Ideation:  Denies  Homicidal Ideation:  Denies  AEB (as evidenced by):  Psychiatric Specialty Exam: Review of Systems  Constitutional: Negative.   HENT: Negative.   Eyes: Negative.   Respiratory: Negative.   Cardiovascular: Negative.   Gastrointestinal:  Negative.   Genitourinary: Negative.   Musculoskeletal: Negative.   Skin: Negative.   Neurological: Negative.   Endo/Heme/Allergies: Negative.   Psychiatric/Behavioral: Positive for depression and substance abuse. Negative for suicidal ideas, hallucinations and memory loss. The patient is nervous/anxious and has insomnia.     Blood pressure 138/83, pulse 92, temperature 98.4 F (36.9 C), temperature source Oral, resp. rate 18, height 6\' 3"  (1.905 m), weight 117.028 kg (258 lb).Body mass index is 32.25 kg/(m^2).  General Appearance: fairly groomed  Patent attorney::  Good  Speech:  Clear and Coherent  Volume:  Normal  Mood:  Anxious   Affect:  brighter  Thought Process:  Goal Directed and Intact  Orientation:  Full (Time, Place, and Person)  Thought Content:  Rumination  Suicidal Thoughts:  No  Homicidal Thoughts:  No  Memory:  Immediate;   Good Recent;   Good Remote;   Good  Judgement:  Fair  Insight:  Shallow  Psychomotor Activity:  Decreased  Concentration:  Good  Recall:  Good  Akathisia:  No  Handed:  Right  AIMS (if indicated):     Assets:  Communication Skills Desire for Improvement Housing Intimacy Leisure Time Physical Health Resilience Social Support  Sleep:  Number of Hours: 5.5   Current Medications: Current Facility-Administered Medications  Medication Dose Route Frequency Provider Last Rate Last Dose  . acetaminophen (TYLENOL) tablet 650 mg  650 mg Oral Q6H PRN Nanine Means, NP      . alum & mag hydroxide-simeth (MAALOX/MYLANTA) 200-200-20 MG/5ML suspension 30  mL  30 mL Oral Q4H PRN Nanine MeansJamison Lord, NP      . Melene Muller[START ON 08/06/2013] citalopram (CELEXA) tablet 20 mg  20 mg Oral Daily Brittlyn Cloe      . hydrochlorothiazide (MICROZIDE) capsule 12.5 mg  12.5 mg Oral Daily Nanine MeansJamison Lord, NP   12.5 mg at 08/05/13 0747  . magnesium hydroxide (MILK OF MAGNESIA) suspension 30 mL  30 mL Oral Daily PRN Nanine MeansJamison Lord, NP      . prazosin (MINIPRESS) capsule 5 mg  5 mg Oral  QHS Maame Dack   5 mg at 08/04/13 2118  . QUEtiapine (SEROQUEL) tablet 50 mg  50 mg Oral QHS Fransisca KaufmannLaura Davis, NP   50 mg at 08/04/13 2120    Lab Results: No results found for this or any previous visit (from the past 48 hour(s)).  Physical Findings: AIMS: Facial and Oral Movements Muscles of Facial Expression: None, normal Lips and Perioral Area: None, normal Jaw: None, normal Tongue: None, normal,Extremity Movements Upper (arms, wrists, hands, fingers): None, normal Lower (legs, knees, ankles, toes): None, normal, Trunk Movements Neck, shoulders, hips: None, normal, Overall Severity Severity of abnormal movements (highest score from questions above): None, normal Incapacitation due to abnormal movements: None, normal Patient's awareness of abnormal movements (rate only patient's report): No Awareness,    CIWA:    COWS:     Treatment Plan Summary: Daily contact with patient to assess and evaluate symptoms and progress in treatment Medication management  Plan: Continue crisis management and stabilization.  Medication management: Continue Seroquel to 50 mg at hs for insomnia/anxiety.  Increase Celexa to 20mg  po daily for PTSD. Encouraged patient to attend groups and participate in group counseling sessions and activities.  Discharge plan in progress.  Continue current treatment plan.   Medical Decision Making Problem Points:  Established problem, stable/improving (1) and Review of psycho-social stressors (1) Data Points:  Review of medication regiment & side effects (2)  I certify that inpatient services furnished can reasonably be expected to improve the patient's condition.   Thedore MinsAkintayo, Jequan Shahin, MD 08/05/2013, 10:45 AM

## 2013-08-05 NOTE — Progress Notes (Addendum)
Pt c/o of chest pain "tightening around his heart". Per pt he has a history of having chest pain. Pt has consulted with his PCP in the past and was told that it's related to stress. Pt denies having anyradiating pain in his arms and denies having tingling sensations in his fingers. Pt rates pain 5/10. Pt reports having a hx of heartburn/indigestion. Pt v/s taken. Pt v/s are within normal limits. O2 within normal limits. Pt given Maalox for possible indigestion. Writer will f/u with pt.   @ 1910 pt reports decreased pain. "It's not as worse as it was". Writer will report off to RN coming onto shift to continue to monitor pt status. Pt safety maintained.

## 2013-08-05 NOTE — Progress Notes (Signed)
[facility] :  Will you be returning to the same living situation after discharge: Yes,  yes At discharge, do you have transportation home?:Yes,  family Do you have the ability to pay for your medications:Yes,  insurance  Release of information consent forms completed and in the chart;  Patient's signature needed at discharge.  Patient to Follow up at: Follow-up Information   Follow up with Daryll BrodMartin Weinrauch MD On 08/11/2013. (Tuesday at 2:45 with Dr.)    Benay Pillowontact information:   [address]  [address]  [place]  [phone]      Patient denies SI/HI:   Yes,  yes    Safety Planning and Suicide Prevention discussed:  Yes,  yes  Ida Rogueorth, Devaughn Savant B 08/05/2013, 1:54 PM

## 2013-08-06 MED ORDER — PNEUMOCOCCAL VAC POLYVALENT 25 MCG/0.5ML IJ INJ: 0.5000 mL | INJECTION | INTRAMUSCULAR | Status: DC

## 2013-08-06 MED ORDER — HYDROCHLOROTHIAZIDE 12.5 MG PO CAPS: 12.5000 mg | ORAL_CAPSULE | Freq: Every day | ORAL | Status: AC

## 2013-08-06 MED ORDER — INFLUENZA VAC SPLIT QUAD 0.5 ML IM SUSP: 0.5000 mL | INTRAMUSCULAR | Status: DC

## 2013-08-06 MED ORDER — PNEUMOCOCCAL VAC POLYVALENT 25 MCG/0.5ML IJ INJ
0.5000 mL | INJECTION | INTRAMUSCULAR | Status: AC | PRN
  Administered 2013-08-06: 0.5 mL via INTRAMUSCULAR

## 2013-08-06 MED ORDER — PRAZOSIN HCL 5 MG PO CAPS: 5.0000 mg | ORAL_CAPSULE | Freq: Every day | ORAL | Status: AC

## 2013-08-06 MED ORDER — INFLUENZA VAC SPLIT QUAD 0.5 ML IM SUSP
0.5000 mL | INTRAMUSCULAR | Status: AC | PRN
  Administered 2013-08-06: 0.5 mL via INTRAMUSCULAR

## 2013-08-06 MED ORDER — QUETIAPINE FUMARATE 50 MG PO TABS: 50.0000 mg | ORAL_TABLET | Freq: Every day | ORAL | Status: AC

## 2013-08-06 MED ORDER — CITALOPRAM HYDROBROMIDE 20 MG PO TABS: 20.0000 mg | ORAL_TABLET | Freq: Every day | ORAL | Status: AC

## 2013-08-06 NOTE — Progress Notes (Signed)
D/C instructions/meds/follow-up appointments reviewed, pt verbalized understanding, pt's belongings returned to pt, samples given. 

## 2013-08-06 NOTE — [facility] Group Notes (Addendum)
Adult Psychoeducational Group Note  Date:  08/06/2013 Time:  1000am  Group Topic/Focus:  Goals Group:   The focus of this group is to help patients establish daily goals to achieve during treatment and discuss how the patient can incorporate goal setting into their daily lives to aide in recovery. Orientation:   The focus of this group is to educate the patient on the purpose and policies of crisis stabilization and provide a format to answer questions about their admission.  The group details unit policies and expectations of patients while admitted.  Participation Level:  Minimal  Participation Quality:  Appropriate and Attentive  Affect:  Flat  Cognitive:  Alert and Appropriate  Insight: Good  Engagement in Group:  Improving  Modes of Intervention:  Discussion, Education, Orientation and Support  Additional Comments:  Pt was attentive during groups set goal for today to work on his discharge today states that after discharge he plans on job hunting and getting back into the workforce.  Pt was appropriate during group.  Alfonse Sprucehorne, Aldwin Micalizzi Brooke 08/06/2013, 11:52 AM
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
## 2013-08-06 NOTE — Progress Notes (Signed)
Adult Psychoeducational Group Note  Date:  08/06/2013 Time:  12:06 PM  Group Topic/Focus:  Therapeutic activity  Participation Level:  Did Not Attend  Participation Quality:    Affect:    Cognitive:    Insight:   Engagement in Group:    Modes of Intervention:    Additional Comments:  Pt did not attend group. [name], [name] 08/06/2013, 12:06 PM

## 2013-08-06 NOTE — [facility] Suicide Risk Assessment (Signed)
Suicide Risk Assessment  Discharge Assessment     Demographic Factors:  Male and African American  Mental Status Per Nursing Assessment::   On Admission:  Thoughts of violence towards others  Current Mental Status by Physician: patient denies suicidal ideation, intent or plan  Loss Factors: Financial problems/change in socioeconomic status  Historical Factors: Impulsivity  Risk Reduction Factors:   Sense of responsibility to family, Living with another person, especially a relative and Positive social support  Continued Clinical Symptoms:  Alcohol/Substance Abuse/Dependencies  Cognitive Features That Contribute To Risk:  Closed-mindedness Polarized thinking    Suicide Risk:  Minimal: No identifiable suicidal ideation.  Patients presenting with no risk factors but with morbid ruminations; may be classified as minimal risk based on the severity of the depressive symptoms  Discharge Diagnoses:   AXIS I:  Posttraumatic stress disorder              Alcohol use disorder AXIS II:  Deferred AXIS III:   Past Medical History  Diagnosis Date  . Chronic lower back pain   . Plantar fasciitis   . Chronic neck pain   . Degenerative arthritis of cervical spine    AXIS IV:  other psychosocial or environmental problems and problems related to social environment AXIS V:  61-70 mild symptoms  Plan Of Care/Follow-up recommendations:  Activity:  as tolerated Diet:  healthy Tests:  routine Other:  patient to keep his after care appointment  Is patient on multiple antipsychotic therapies at discharge:  No   Has Patient had three or more failed trials of antipsychotic monotherapy by history:  No  Recommended Plan for Multiple Antipsychotic Therapies: NA  Thedore MinsAkintayo, Marlow Berenguer, MD 08/06/2013, 10:41 AM

## 2013-08-08 NOTE — Discharge Summary (Signed)
Seen and agreed. Jeyren Danowski, MD 

## 2013-08-11 NOTE — Progress Notes (Signed)
Patient Discharge Instructions:  After Visit Summary (AVS):   Faxed to:  08/11/13 Discharge Summary Note:   Faxed to:  08/11/13 Psychiatric Admission Assessment Note:   Faxed to:  08/11/13 Suicide Risk Assessment - Discharge Assessment:   Faxed to:  08/11/13 Faxed/Sent to the Next Level Care provider:  08/11/13 [facility] to [facility] @ [phone]  Jerelene ReddenSheena E [place], 08/11/2013, 3:03 PM

## 2015-08-05 IMAGING — CT CT HEAD W/O CM
1 series · 16 of 30 positions shown, 20 images · non-contrast
Comparison: None available

CLINICAL DATA: Altered mental status

EXAM:
CT HEAD WITHOUT CONTRAST
TECHNIQUE: Contiguous axial images were obtained from the base of the skull
through the vertex without intravenous contrast.

[Series 2: head 5.0 h30s · axial · 0.43mm/px · z∈[-209,-64]mm · 16 of 33 slices shown, 20 images]
[im 2/33  brain]
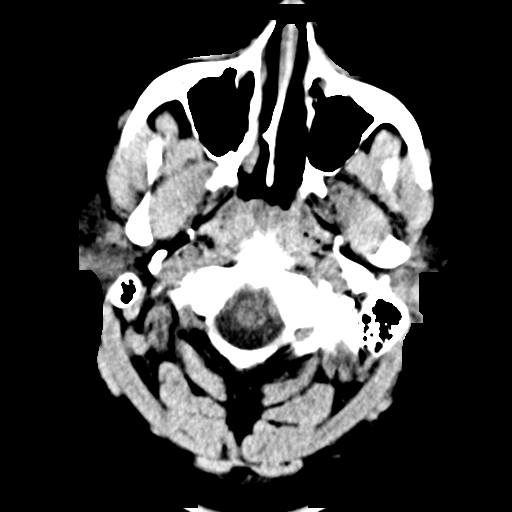
[im 2/33  bone]
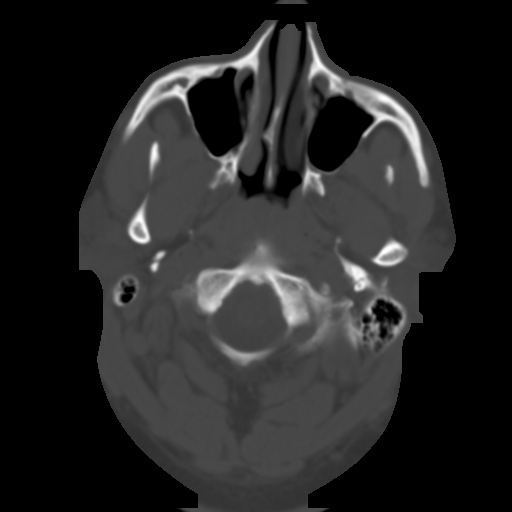
[im 4/33  brain]
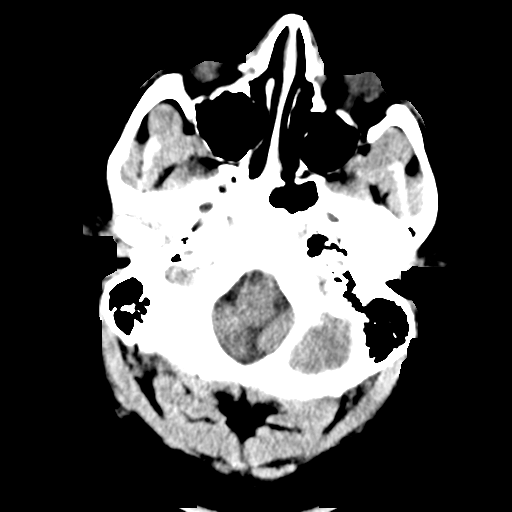
[im 6/33  brain]
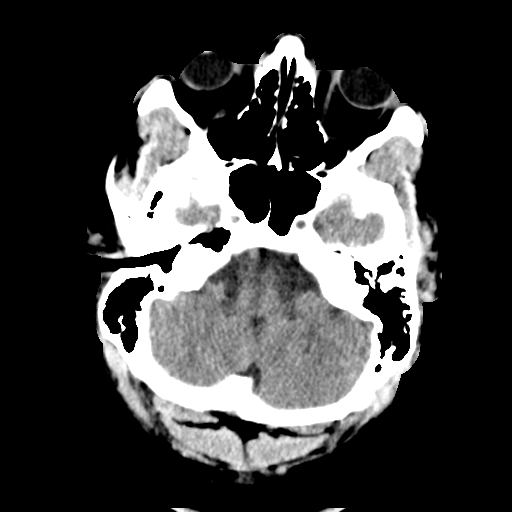
[im 8/33  brain]
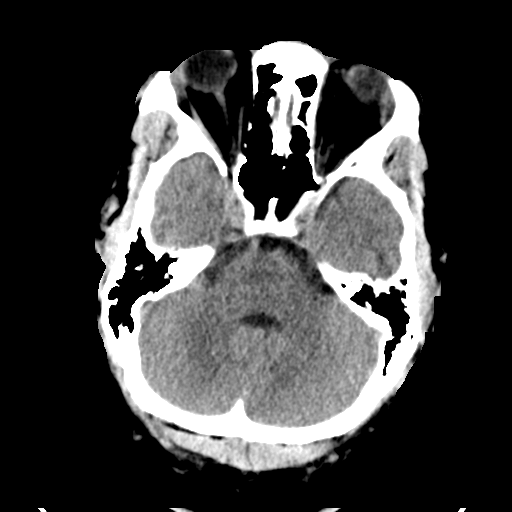
[im 9/33  brain]
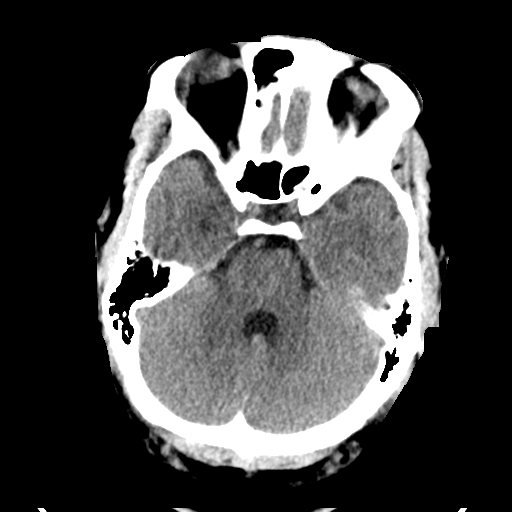
[im 9/33  bone]
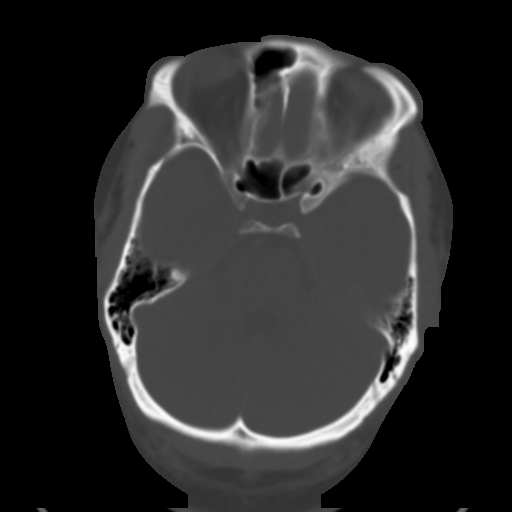
[im 12/33  brain]
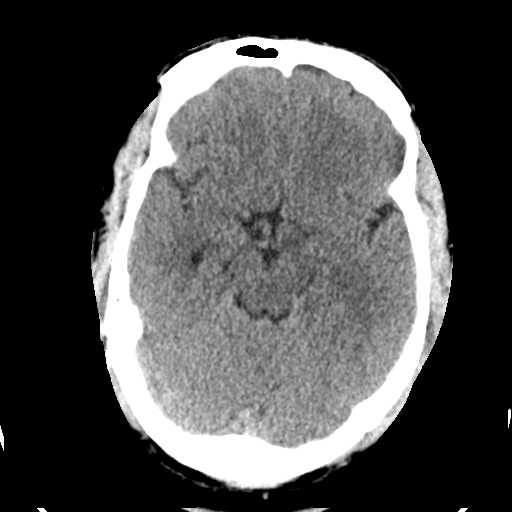
[im 14/33  brain]
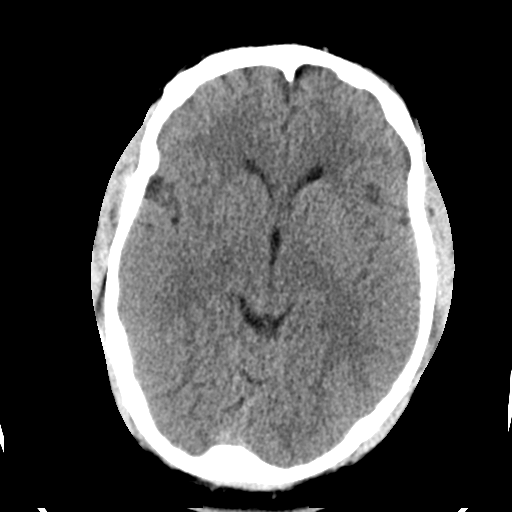
[im 16/33  brain]
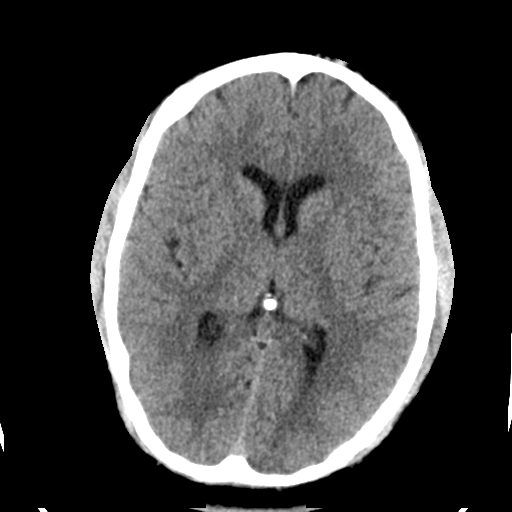
[im 17/33  brain]
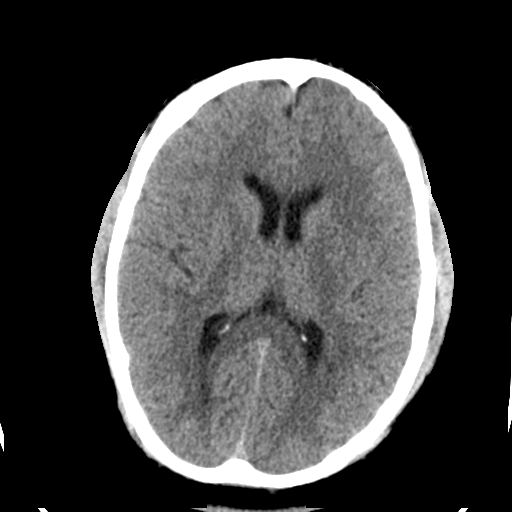
[im 17/33  bone]
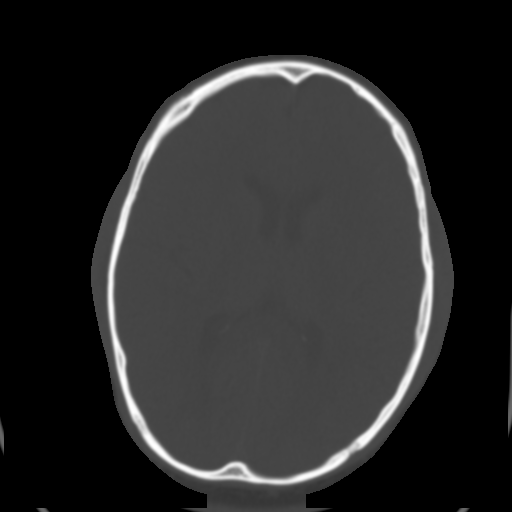
[im 19/33  brain]
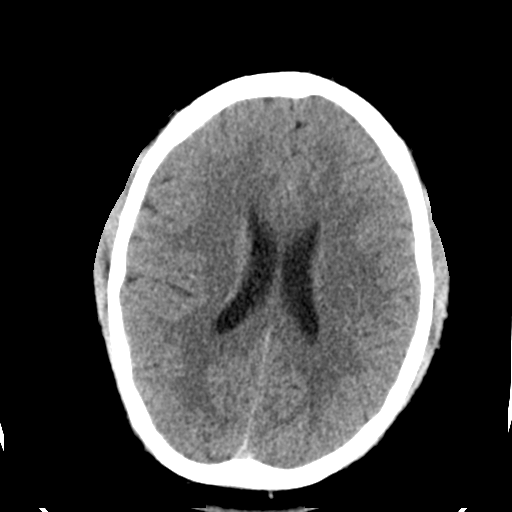
[im 21/33  brain]
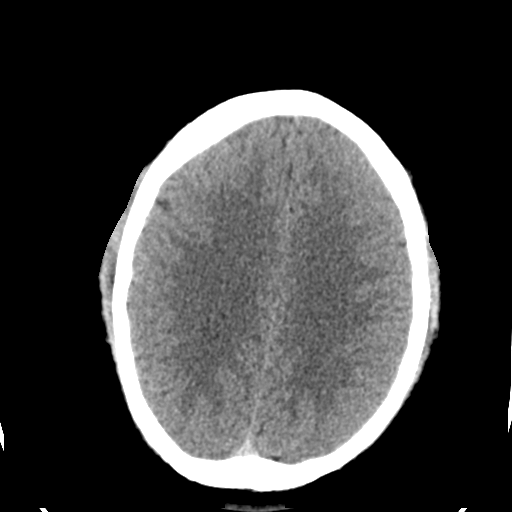
[im 24/33  brain]
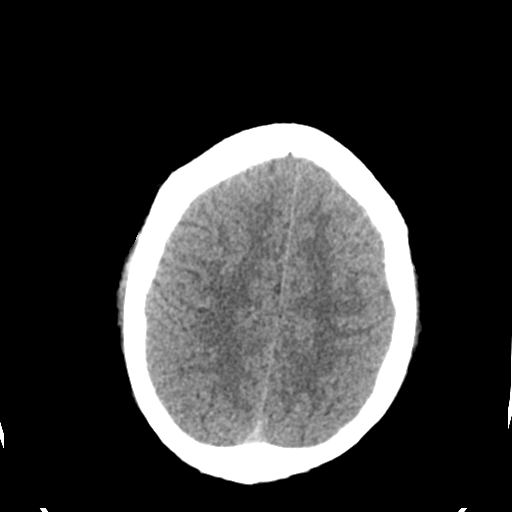
[im 25/33  brain]
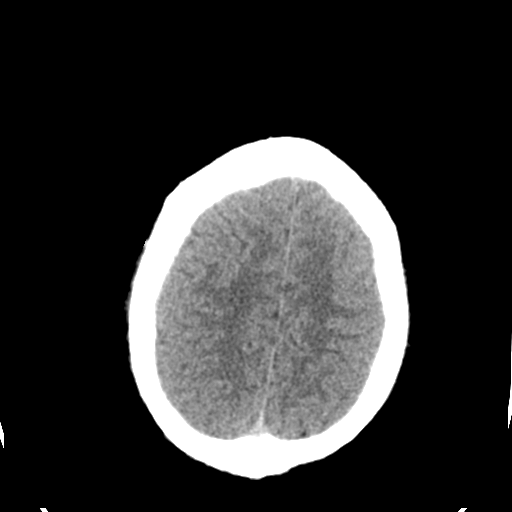
[im 25/33  bone]
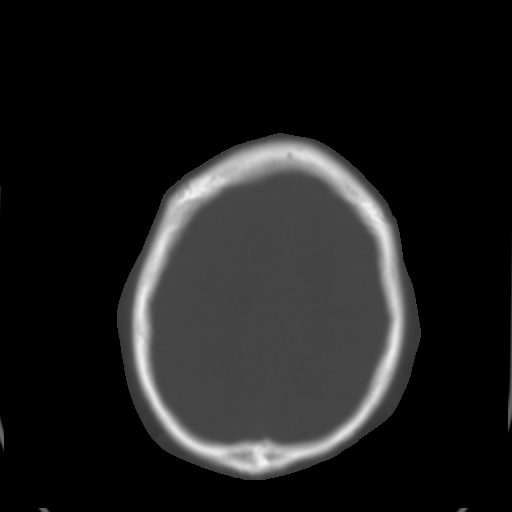
[im 27/33  brain]
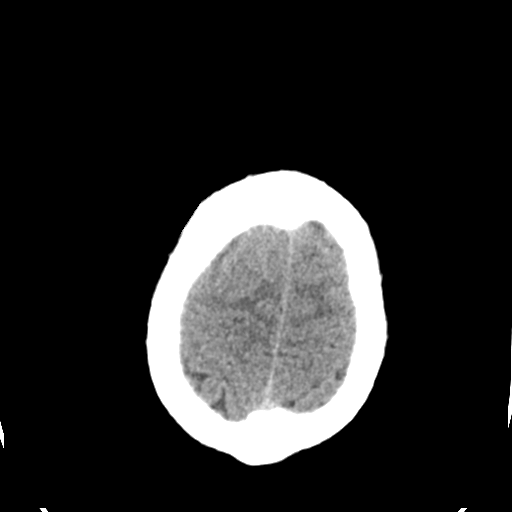
[im 29/33  brain]
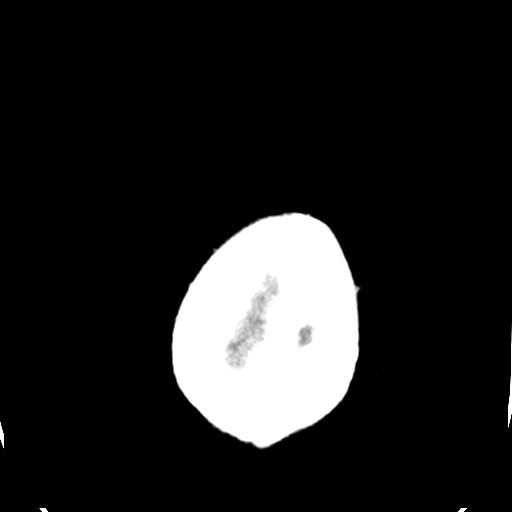
[im 31/33  brain]
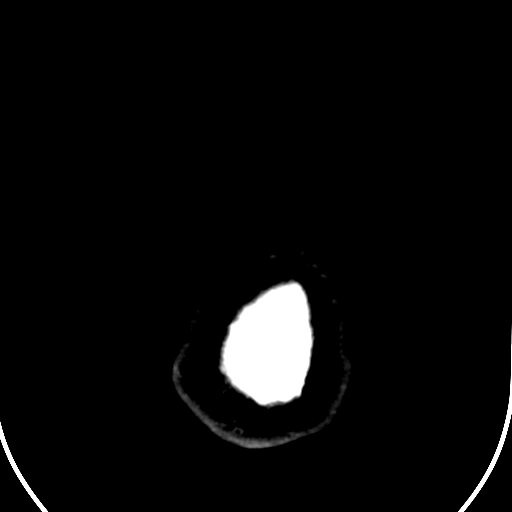

[16 of 30 positions shown; findings below may reference images not displayed]

FINDINGS: There is no acute intracranial hemorrhage or infarct. No mass lesion
or midline shift. Gray-white matter differentiation is well
maintained. Ventricles are normal in size without evidence of
hydrocephalus. CSF containing spaces are within normal limits. No
extra-axial fluid collection.

The calvarium is intact.

Orbital soft tissues are within normal limits.

The paranasal sinuses and mastoid air cells are well pneumatized and
free of fluid.

Scalp soft tissues are unremarkable.
IMPRESSION: No acute intracranial process.
